# Patient Record
Sex: Male | Born: 1995 | Race: White | Hispanic: No | Marital: Single | Smoking: Never smoker
Health system: Southern US, Community
[De-identification: ages and names within clinical notes are randomized; demographics above are authoritative.]

---
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
## 2016-07-13 IMAGING — MR MR LUMBAR SPINE W/O CM
4 of 11 series · 19 of 48 positions shown · non-contrast
Comparison: None available.

CLINICAL DATA: Numbness in the left lower extremity. Abnormal
temperature sensation in the left lower extremity. Numbness in the
right thigh. Numbness in the abdomen.

EXAM:
MRI THORACIC AND LUMBAR SPINE WITHOUT CONTRAST
TECHNIQUE: Multiplanar and multiecho pulse sequences of the thoracic and lumbar
spine were obtained without intravenous contrast.

[Series 4: T2 · sagittal · 3.0mm · 0.62mm/px · 3 of 12 slices shown (1 of 4)]
[im 1/12]
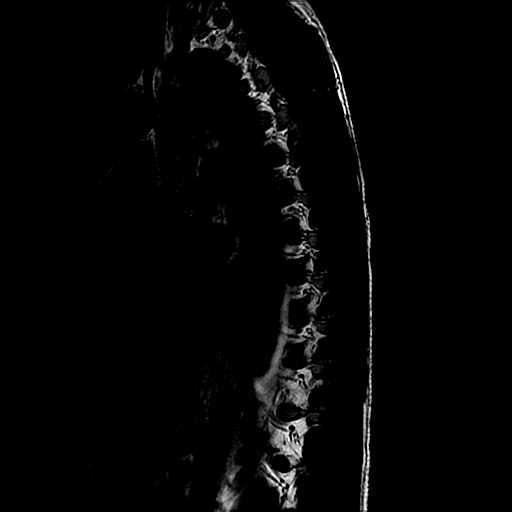
[im 6/12]
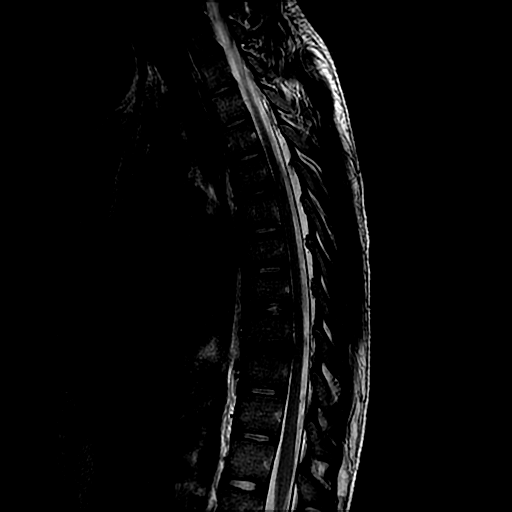
[im 12/12]
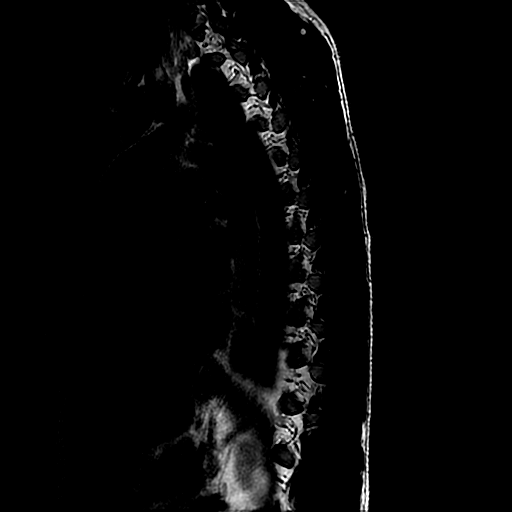

[Series 7: T2 · axial · 4.0mm · 0.43mm/px · z∈[-216,-13]mm · 6 of 33 slices shown (2 of 4)]
[im 1/33]
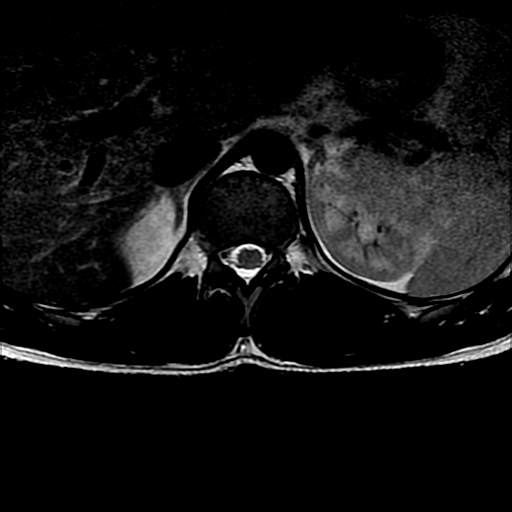
[im 7/33]
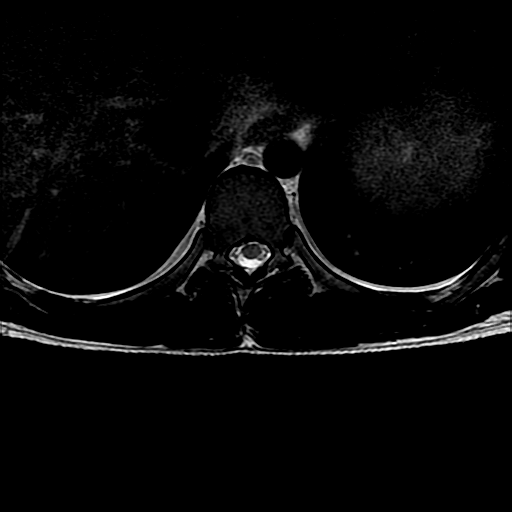
[im 13/33]
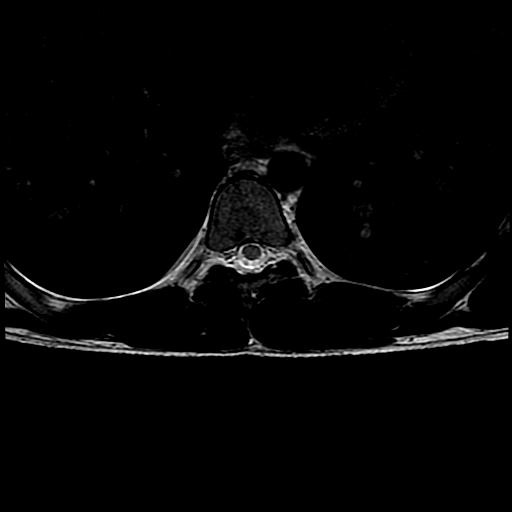
[im 20/33]
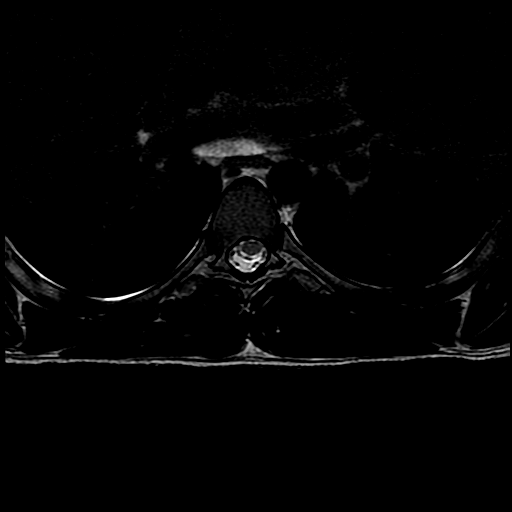
[im 26/33]
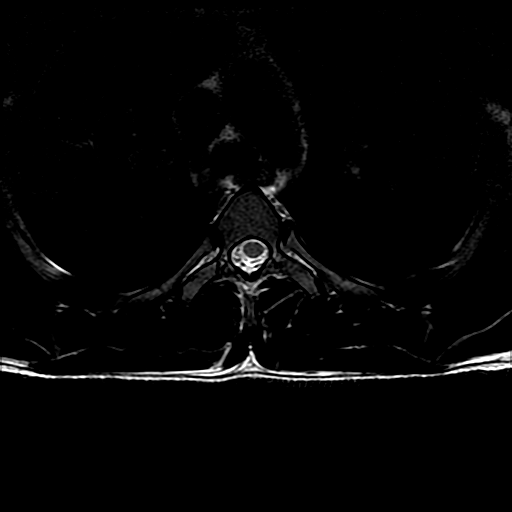
[im 33/33]
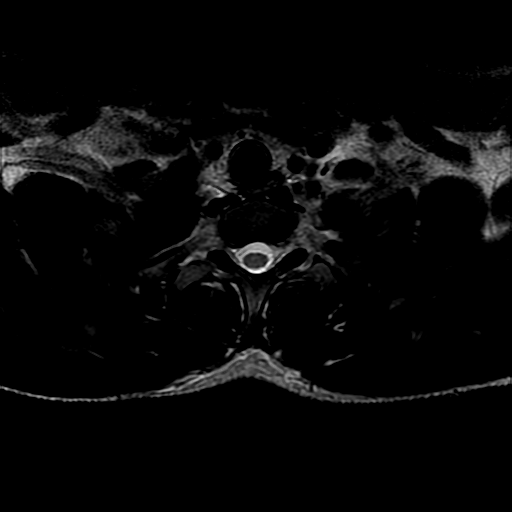

[Series 12: T2 · sagittal · 4.0mm · 0.57mm/px · 2 of 12 slices shown (3 of 4)]
[im 1/12]
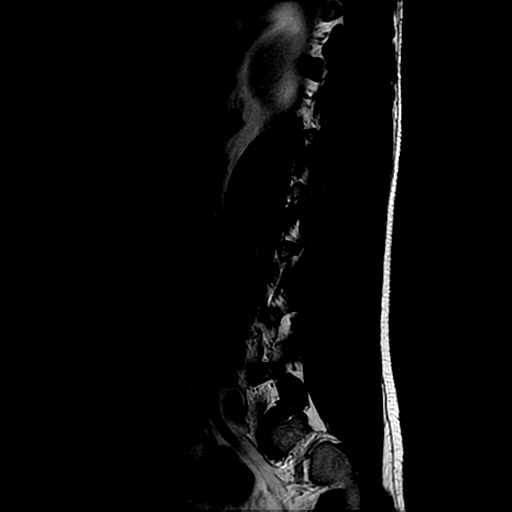
[im 12/12]
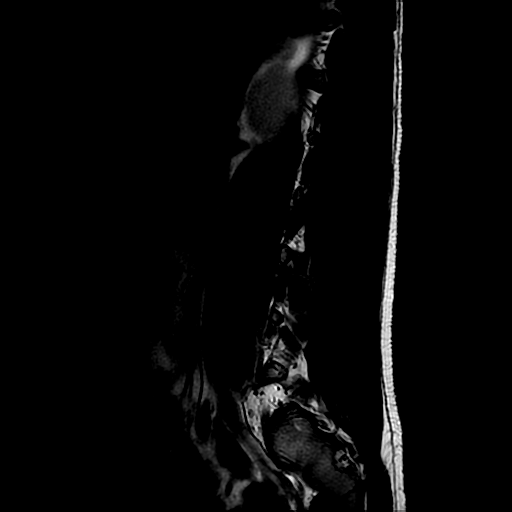

[Series 15: T2 · axial · 4.0mm · 0.39mm/px · z∈[-443,-223]mm · 8 of 39 slices shown (4 of 4)]
[im 1/39]
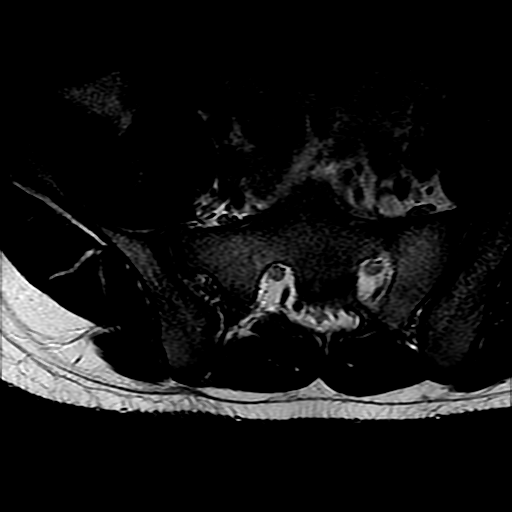
[im 6/39]
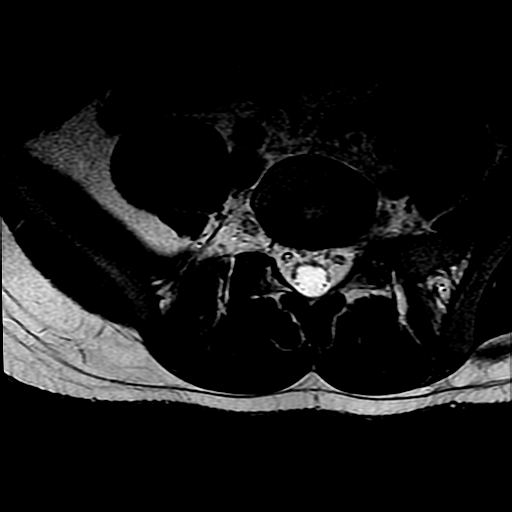
[im 11/39]
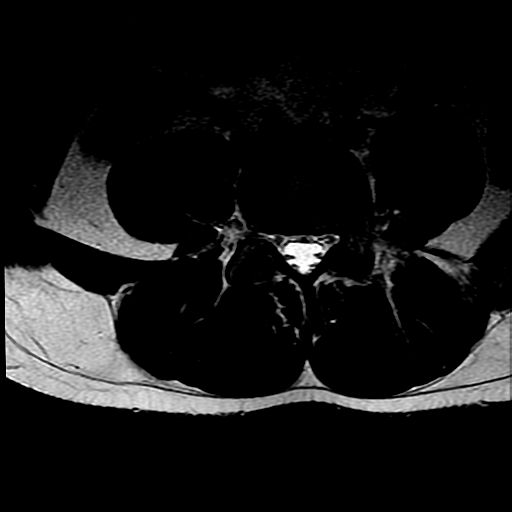
[im 17/39]
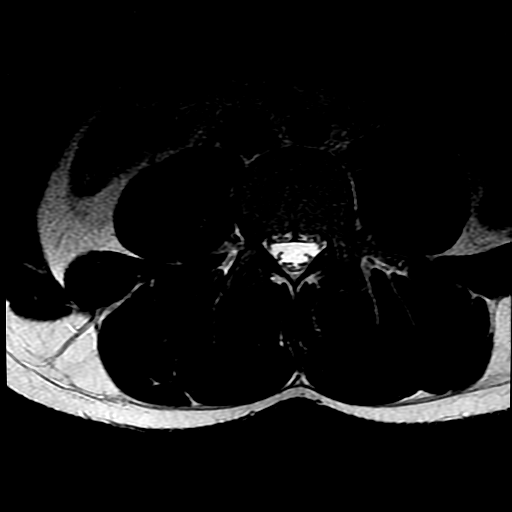
[im 22/39]
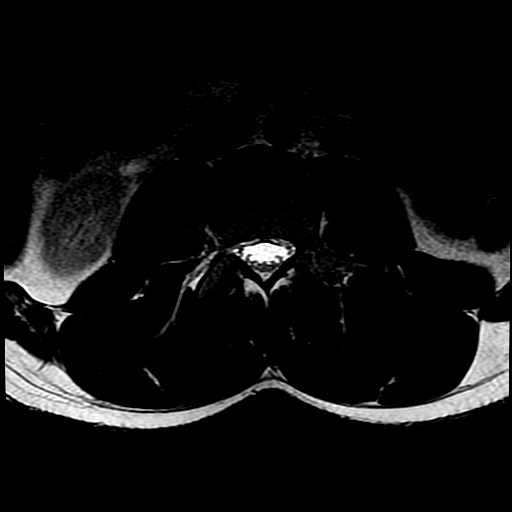
[im 28/39]
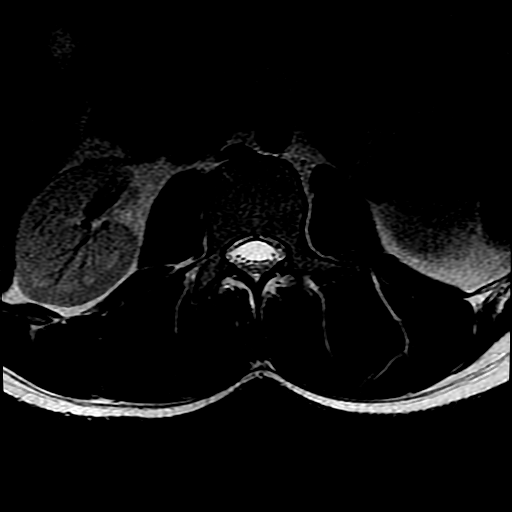
[im 33/39]
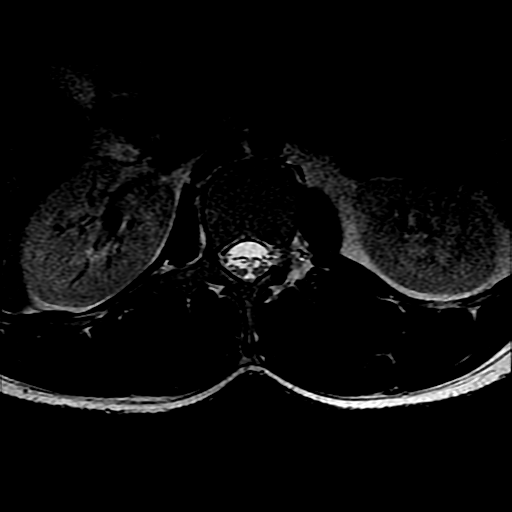
[im 39/39]
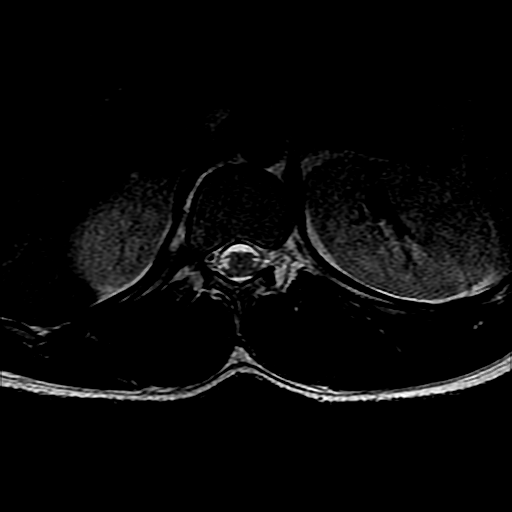

[19 of 48 positions shown; findings below may reference images not displayed]

FINDINGS: MR THORACIC SPINE FINDINGS

Normal signal is present throughout the thoracic spinal cord. A
Schmorl's node is present at T8-9 with some reactive change. There
is a shallow right paramedian disc protrusion at T9-10 which may
contact the ventral surface the cord. No other significant disc
protrusion is present. There is no other significant stenosis. The
foramina are widely patent bilaterally.

MR LUMBAR SPINE FINDINGS

Normal signal is present in the conus medullaris which terminates at
L1. Marrow signal, vertebral body heights, and alignment are normal.
Disc signal is present throughout the lumbar spine. No significant
focal disc protrusion or stenosis is evident. The foramina are
patent bilaterally.
IMPRESSION: MR THORACIC SPINE IMPRESSION

1. Normal signal throughout the thoracic spinal cord.
2. Schmorl's node with chronic reactive endplate changes at T8-9.
3. Right paramedian disc protrusion at T9-10 with possible contact
of the ventral surface the cord but no abnormal signal.

MR LUMBAR SPINE IMPRESSION

Negative MRI of the lumbar spine.

## 2021-09-12 ENCOUNTER — Encounter (HOSPITAL_BASED_OUTPATIENT_CLINIC_OR_DEPARTMENT_OTHER): Payer: Self-pay | Admitting: Emergency Medicine

## 2021-09-12 ENCOUNTER — Other Ambulatory Visit: Payer: Self-pay

## 2021-09-12 ENCOUNTER — Emergency Department (HOSPITAL_BASED_OUTPATIENT_CLINIC_OR_DEPARTMENT_OTHER)
Admission: EM | Admit: 2021-09-12 | Discharge: 2021-09-12 | Disposition: A | Discharge status: Home / Self Care | Payer: 59 | Attending: Emergency Medicine | Admitting: Emergency Medicine

## 2021-09-12 DIAGNOSIS — Z4802 Encounter for removal of sutures: Secondary | ICD-10-CM | POA: Insufficient documentation

## 2021-09-12 DIAGNOSIS — S0181XA Laceration without foreign body of other part of head, initial encounter: Secondary | ICD-10-CM

## 2021-09-12 NOTE — Discharge Instructions (Signed)
You may follow-up with the plastic surgeon at your discretion. ? ?I do recommend obtaining a copy of your CT read or perhaps images as well so be helpful to the plastic surgery team. ? ?Apply the ointment given to you here (bacitracin) twice daily to keep the area moist.  ?
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
## 2021-09-12 NOTE — ED Triage Notes (Signed)
Pt asking for evaluation of stitches and if they need to be removed or remain stitches. Placed last Thursday. ?
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
